# Patient Record
Sex: Female | Born: 1995 | Race: White | Hispanic: No | Marital: Single
Health system: Southern US, Community
[De-identification: ages and names within clinical notes are randomized; demographics above are authoritative.]

---

## 2015-04-18 ENCOUNTER — Other Ambulatory Visit: Payer: Self-pay | Admitting: Family Medicine

## 2015-04-18 DIAGNOSIS — N644 Mastodynia: Secondary | ICD-10-CM

## 2015-04-20 ENCOUNTER — Ambulatory Visit
Admission: RE | Admit: 2015-04-20 | Discharge: 2015-04-20 | Disposition: A | Discharge status: Home / Self Care | Payer: BLUE CROSS/BLUE SHIELD | Source: Ambulatory Visit | Attending: Family Medicine | Admitting: Family Medicine

## 2015-04-20 ENCOUNTER — Other Ambulatory Visit: Payer: Self-pay | Admitting: Family Medicine

## 2015-04-20 DIAGNOSIS — N644 Mastodynia: Secondary | ICD-10-CM | POA: Insufficient documentation

## 2017-08-01 ENCOUNTER — Other Ambulatory Visit: Payer: Self-pay | Admitting: Student

## 2017-08-01 DIAGNOSIS — N63 Unspecified lump in unspecified breast: Secondary | ICD-10-CM

## 2018-06-15 ENCOUNTER — Other Ambulatory Visit: Payer: Self-pay

## 2018-06-15 ENCOUNTER — Emergency Department: Payer: BLUE CROSS/BLUE SHIELD

## 2018-06-15 ENCOUNTER — Encounter: Payer: Self-pay | Admitting: Emergency Medicine

## 2018-06-15 ENCOUNTER — Emergency Department
Admission: EM | Admit: 2018-06-15 | Discharge: 2018-06-15 | Disposition: A | Discharge status: Home / Self Care | Payer: BLUE CROSS/BLUE SHIELD | Attending: Emergency Medicine | Admitting: Emergency Medicine

## 2018-06-15 DIAGNOSIS — R1030 Lower abdominal pain, unspecified: Secondary | ICD-10-CM | POA: Diagnosis present

## 2018-06-15 DIAGNOSIS — Z331 Pregnant state, incidental: Secondary | ICD-10-CM | POA: Diagnosis not present

## 2018-06-15 DIAGNOSIS — Z3A01 Less than 8 weeks gestation of pregnancy: Secondary | ICD-10-CM

## 2018-06-15 LAB — CBC
HCT: 41.5 % (ref 36.0–46.0)
Hemoglobin: 14 g/dL (ref 12.0–15.0)
MCH: 29.7 pg (ref 26.0–34.0)
MCHC: 33.7 g/dL (ref 30.0–36.0)
MCV: 88.1 fL (ref 80.0–100.0)
NRBC: 0 % (ref 0.0–0.2)
Platelets: 343 10*3/uL (ref 150–400)
RBC: 4.71 MIL/uL (ref 3.87–5.11)
RDW: 12.8 % (ref 11.5–15.5)
WBC: 9.7 10*3/uL (ref 4.0–10.5)

## 2018-06-15 LAB — URINALYSIS, COMPLETE (UACMP) WITH MICROSCOPIC
BILIRUBIN URINE: NEGATIVE
Bacteria, UA: NONE SEEN
Glucose, UA: NEGATIVE mg/dL
HGB URINE DIPSTICK: NEGATIVE
Ketones, ur: NEGATIVE mg/dL
LEUKOCYTES UA: NEGATIVE
Nitrite: NEGATIVE
Protein, ur: NEGATIVE mg/dL
Specific Gravity, Urine: 1.016 (ref 1.005–1.030)
pH: 7 (ref 5.0–8.0)

## 2018-06-15 LAB — COMPREHENSIVE METABOLIC PANEL
ALT: 17 U/L (ref 0–44)
AST: 19 U/L (ref 15–41)
Albumin: 4.2 g/dL (ref 3.5–5.0)
Alkaline Phosphatase: 48 U/L (ref 38–126)
Anion gap: 8 (ref 5–15)
BUN: 7 mg/dL (ref 6–20)
CO2: 23 mmol/L (ref 22–32)
Calcium: 8.9 mg/dL (ref 8.9–10.3)
Chloride: 100 mmol/L (ref 98–111)
Creatinine, Ser: 0.53 mg/dL (ref 0.44–1.00)
GFR calc Af Amer: >60 mL/min (ref >60)
GFR calc non Af Amer: >60 mL/min (ref >60)
Glucose, Bld: 86 mg/dL (ref 70–99)
Potassium: 3.8 mmol/L (ref 3.5–5.1)
Sodium: 131 mmol/L — ABNORMAL LOW (ref 135–145)
Total Bilirubin: 0.8 mg/dL (ref 0.3–1.2)
Total Protein: 8.3 g/dL — ABNORMAL HIGH (ref 6.5–8.1)

## 2018-06-15 LAB — LIPASE, BLOOD: Lipase: 26 U/L (ref 11–51)

## 2018-06-15 LAB — HCG, QUANTITATIVE, PREGNANCY: hCG, Beta Chain, Quant, S: 63032 m[IU]/mL — ABNORMAL HIGH (ref <5)

## 2018-06-15 LAB — POCT PREGNANCY, URINE: Preg Test, Ur: POSITIVE — AB

## 2018-06-15 NOTE — ED Triage Notes (Signed)
States began diffuse abd pain 9 days ago followed by diarrhea and now vomiting.

## 2018-06-15 NOTE — ED Notes (Signed)
Pt denies questions at this time. Pt requesting to change her address. Registration made aware.

## 2018-06-15 NOTE — ED Provider Notes (Signed)
[facility] Emergency Department Provider Note   ____________________________________________    I have reviewed the triage vital signs and the nursing notes.   HISTORY  Chief Complaint Abdominal Pain and Emesis     HPI Tracy Potts is a 22 y.o. female who presents with complaints of lower abdominal discomfort/cramping with occasional nausea over the last 1 to 1-1/2 weeks.  She denies fevers or chills.  No dysuria.  No vaginal discharge.  No vaginal bleeding.  She reports her last menstrual cycle was 1 month ago at usual time.  She is on birth control pills.  Has not taken anything for her discomfort.  No radiation of pain  History reviewed. No pertinent past medical history.  There are no active problems to display for this patient.   History reviewed. No pertinent surgical history.  Prior to Admission medications   Not on File     Allergies Amoxicillin; Azithromycin; Nsaids; and Penicillins  Family History  Problem Relation Age of Onset  . Breast cancer Other   . Breast cancer Other     Social History Social History   Tobacco Use  . Smoking status: Not on file  Substance Use Topics  . Alcohol use: Not on file  . Drug use: Not on file    Review of Systems  Constitutional: No fever/chills Eyes: No visual changes.  ENT: No sore throat. Cardiovascular: Denies chest pain. Respiratory: Denies shortness of breath. Gastrointestinal: As above Genitourinary: As above Musculoskeletal: Negative for back pain. Skin: Negative for rash. Neurological: Negative for headaches   ____________________________________________   PHYSICAL EXAM:  VITAL SIGNS: ED Triage Vitals  Enc Vitals Group     BP 06/15/18 1323 (!) 151/99     Pulse Rate 06/15/18 1323 81     Resp 06/15/18 1323 20     Temp 06/15/18 1323 98.5 F (36.9 C)     Temp Source 06/15/18 1323 Oral     SpO2 06/15/18 1323 100 %     Weight 06/15/18 1324 97.1 kg (214 lb)   Height 06/15/18 1324 1.778 m (5\' 10" )     Head Circumference --      Peak Flow --      Pain Score 06/15/18 1324 7     Pain Loc --      Pain Edu? --      Excl. in GC? --     Constitutional: Alert and oriented. No acute distress. Pleasant and interactive Eyes: Conjunctivae are normal.   Nose: No congestion/rhinnorhea. Mouth/Throat: Mucous membranes are moist.    Cardiovascular: Normal rate, regular rhythm. Grossly normal heart sounds.  Good peripheral circulation. Respiratory: Normal respiratory effort.  No retractions. Lungs CTAB. Gastrointestinal: Soft and nontender. No distention.   Genitourinary: deferred Musculoskeletal: No lower extremity tenderness nor edema.  Warm and well perfused Neurologic:  Normal speech and language. No gross focal neurologic deficits are appreciated.  Skin:  Skin is warm, dry and intact. No rash noted. Psychiatric: Mood and affect are normal. Speech and behavior are normal.  ____________________________________________   LABS (all labs ordered are listed, but only abnormal results are displayed)  Labs Reviewed  COMPREHENSIVE METABOLIC PANEL - Abnormal; Notable for the following components:      Result Value   Sodium 131 (*)    Total Protein 8.3 (*)    All other components within normal limits  URINALYSIS, COMPLETE (UACMP) WITH MICROSCOPIC - Abnormal; Notable for the following components:   Color, Urine YELLOW (*)  APPearance HAZY (*)    All other components within normal limits  HCG, QUANTITATIVE, PREGNANCY - Abnormal; Notable for the following components:   hCG, Beta Chain, Quant, S 63,032 (*)    All other components within normal limits  POCT PREGNANCY, URINE - Abnormal; Notable for the following components:   Preg Test, Ur POSITIVE (*)    All other components within normal limits  LIPASE, BLOOD  CBC  POC URINE PREG, ED    ____________________________________________  EKG  None ____________________________________________  RADIOLOGY  Ultrasound demonstrates IUP, subchorionic hemorrhage noted and discussed with patient ____________________________________________   PROCEDURES  Procedure(s) performed: No  Procedures   Critical Care performed: No ____________________________________________   INITIAL IMPRESSION / ASSESSMENT AND PLAN / ED COURSE  Pertinent labs & imaging results that were available during my care of the patient were reviewed by me and considered in my medical decision making (see chart for details).  Patient presents with lower abdominal discomfort, inform patient of positive pregnancy test, she was startled by this.  Ultrasound demonstrates IUP with 2 subchorionic areas of hemorrhage.  Discussed possible increased risk of miscarriage given this.  Recommend prenatal vitamins, smoking cessation, avoidance of alcohol, follow-up with GYN    ____________________________________________   FINAL CLINICAL IMPRESSION(S) / ED DIAGNOSES  Final diagnoses:  Less than [redacted] weeks gestation of pregnancy        Note:  This document was prepared using Dragon voice recognition software and may include unintentional dictation errors.    Jene EveryKinner, Alleah Dearman, MD 06/15/18 2258

## 2019-03-17 IMAGING — US US OB COMP LESS 14 WK
1 of 2 series · 13 of 28 positions shown · non-contrast
Comparison: None.

CLINICAL DATA: Lower abdominal pain

EXAM:
OBSTETRIC <14 WK US AND TRANSVAGINAL OB US
TECHNIQUE: Both transabdominal and transvaginal ultrasound examinations were
performed for complete evaluation of the gestation as well as the
maternal uterus, adnexal regions, and pelvic cul-de-sac.
Transvaginal technique was performed to assess early pregnancy.

[Series 1: us ob comp less 14 wk · 0.20mm/px · 89 acquisitions, 13 frames shown]
[im 4/89]
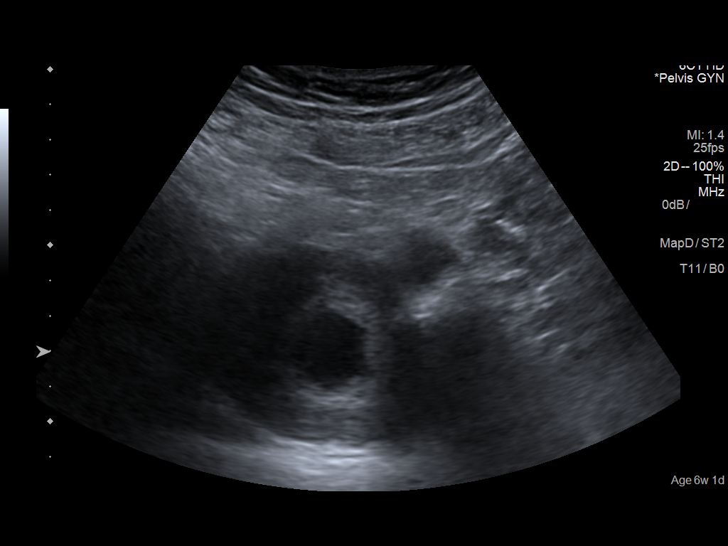
[im 11/89]
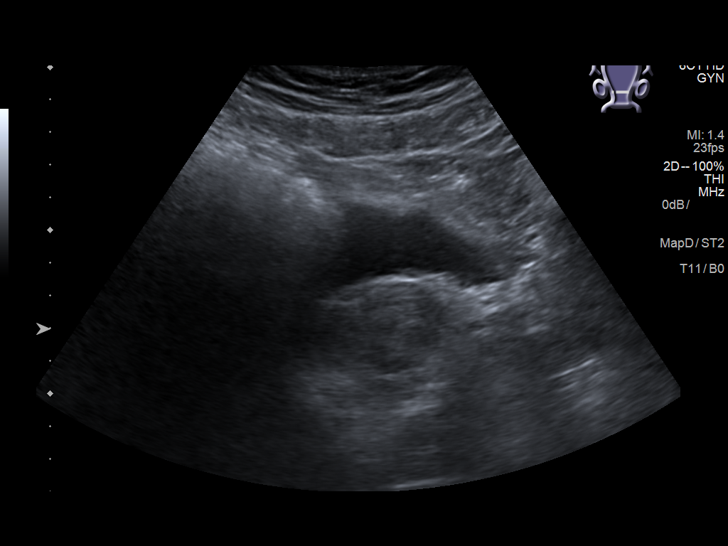
[im 17/89]
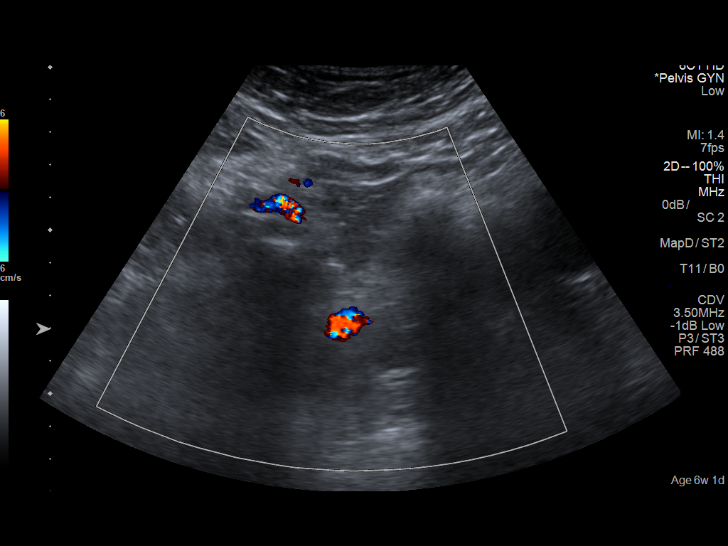
[im 24/89]
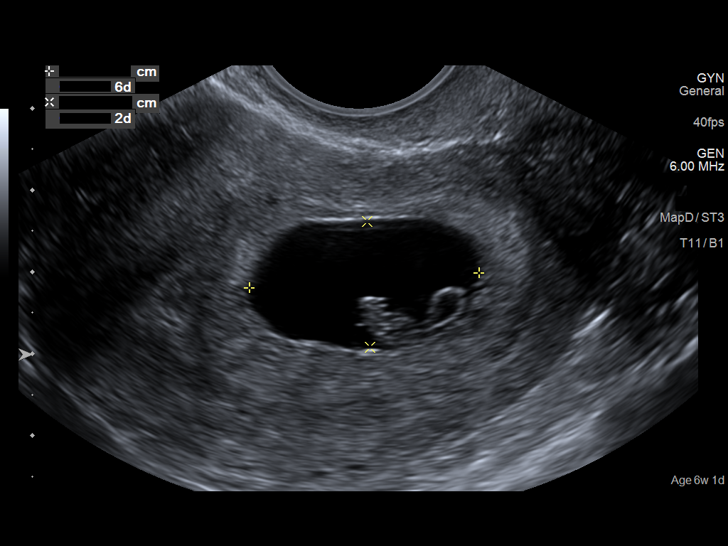
[im 31/89]
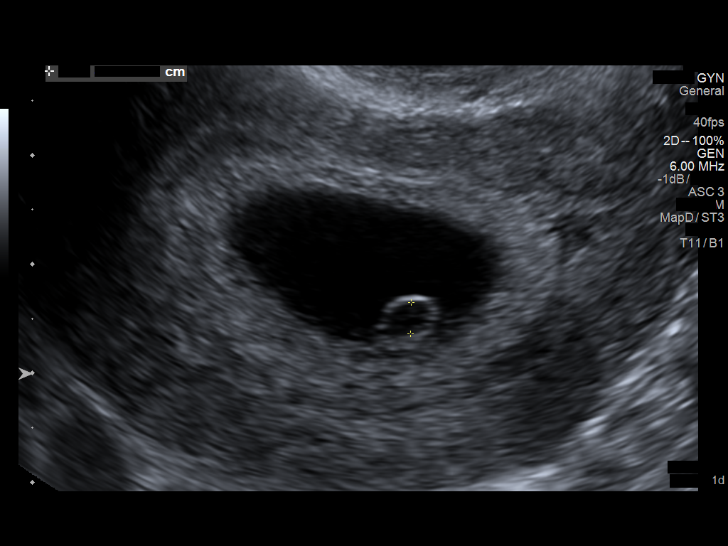
[im 38/89]
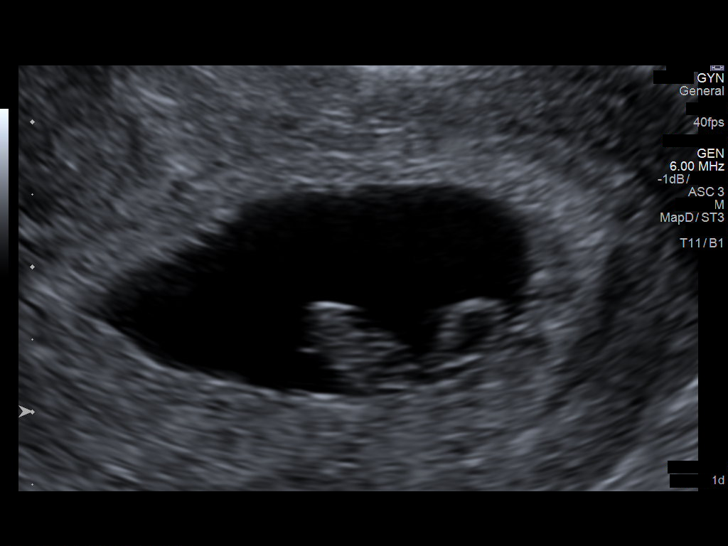
[im 48/89]
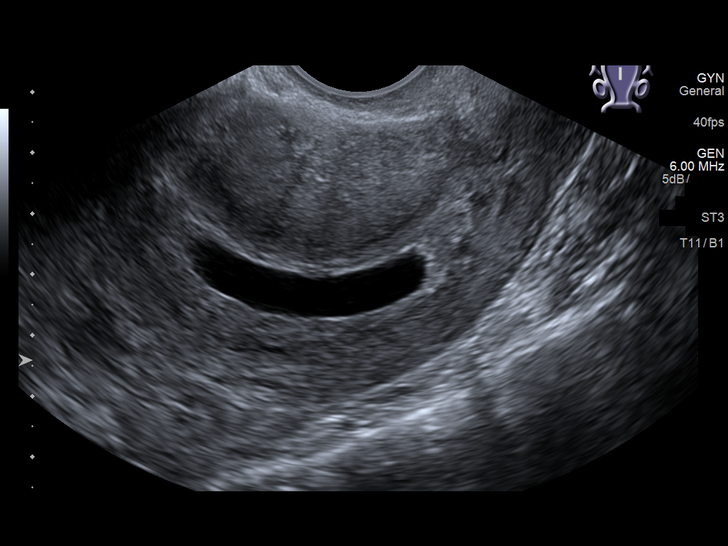
[im 55/89]
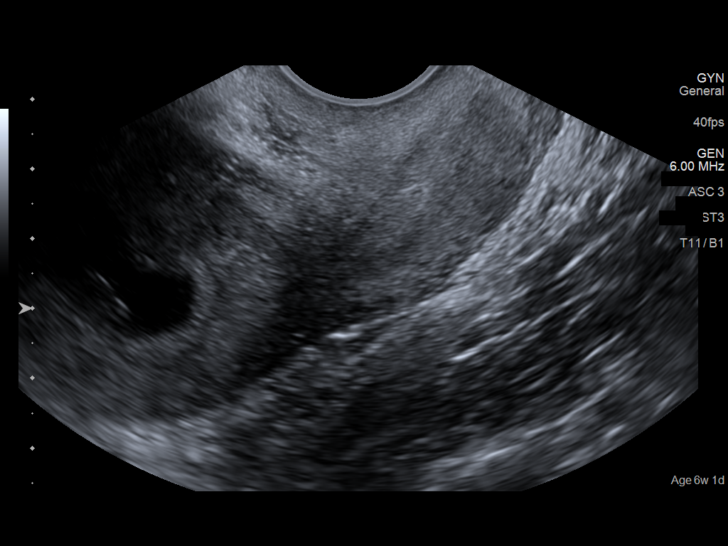
[im 61/89]
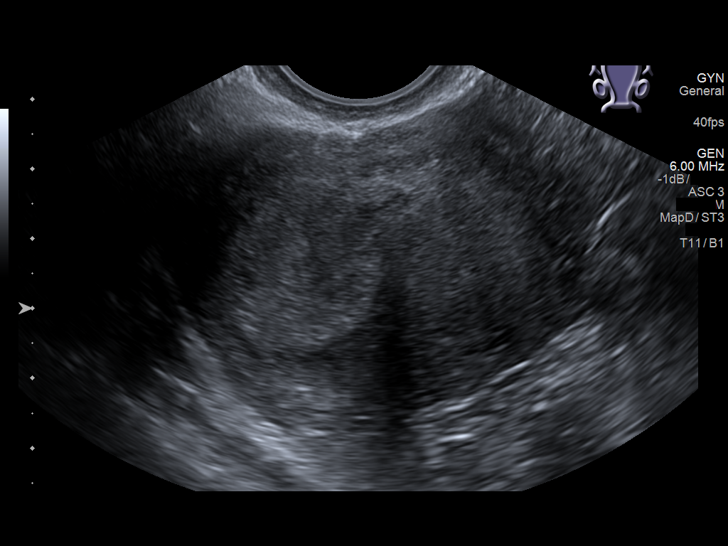
[im 68/89]
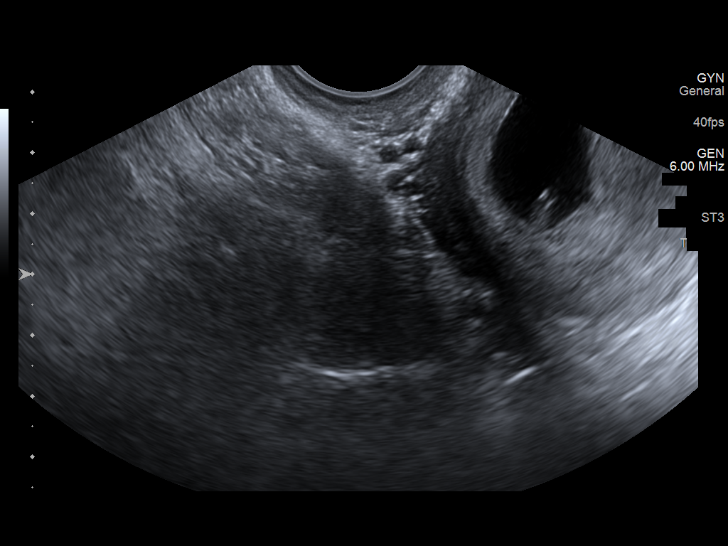
[im 75/89]
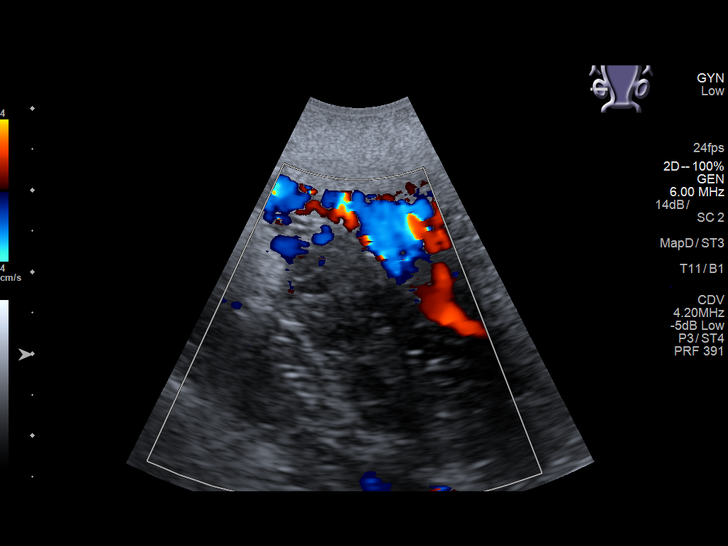
[im 82/89]
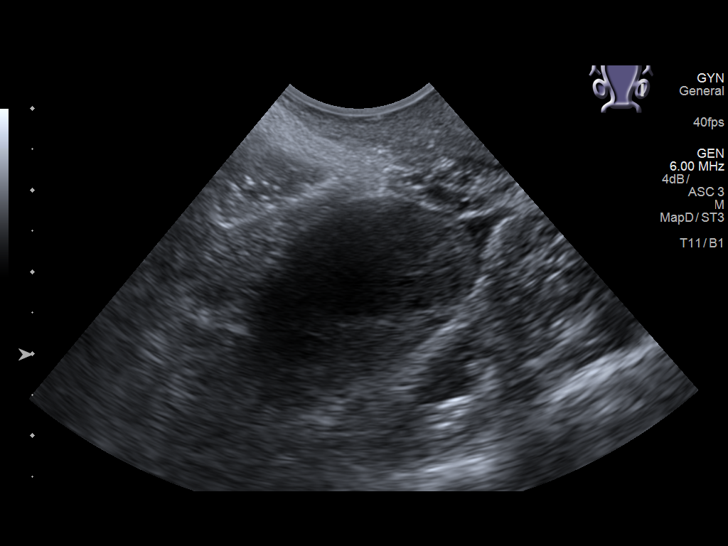
[im 89/89]
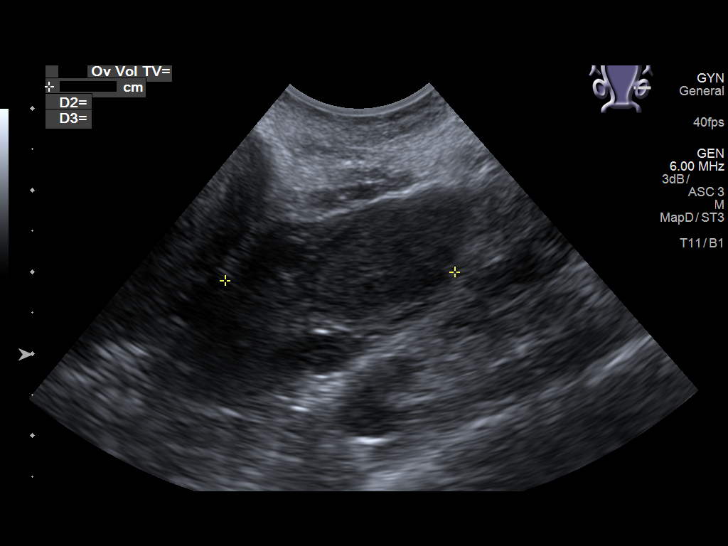

[13 of 28 positions shown; findings below may reference images not displayed]

FINDINGS: Intrauterine gestational sac: Single intrauterine pregnancy

Yolk sac:  Visible

Embryo:  Visible

Cardiac Activity: Visible

Heart Rate: 131 bpm

CRL: 9 mm mm   6 w   6 d                  US EDC: 02/02/2019

Subchorionic hemorrhage: Moderate subchorionic hemorrhage along the
left inferior aspect of the sac measuring 1.4 x 1.6 x 1.1 cm.
Similar appearing hypoechoic probable subchorionic hemorrhage along
the superior sac measuring 1.1 x 1.7 x 2.2 cm.

Maternal uterus/adnexae: Poorly defined isoechoic mass within the
anterior fundal region of the uterus measuring 2.9 x 2.9 x 3.3 cm.
Right ovary is normal and measures 2.5 x 1.4 x 1.7 cm with a volume
of 3.2 mL. The left ovary measures 2.8 x 3.2 x 2 cm with a volume of
9.4 mL. Mildly hypoechoic circumscribed area in the left ovary
measuring 2.4 cm without internal flow. Trace free fluid.
IMPRESSION: 1. Single viable intrauterine pregnancy as above.
2. 2 areas of moderate subchorionic hemorrhage as described above
3. Poorly defined isoechoic mass within the fundus of the uterus,
possible fibroid
4. Slightly hypoechoic avascular mass left ovary measuring 2.4 cm,
questionable for hemorrhagic corpus luteum.
5. Trace free fluid in the pelvis
